# Patient Record
Sex: Male | Born: 1990 | Race: White | Hispanic: No | Marital: Married | Smoking: Current every day smoker
Health system: Southern US, Community
[De-identification: ages and names within clinical notes are randomized; demographics above are authoritative.]

## PROBLEM LIST (undated history)

## (undated) DIAGNOSIS — K649 Unspecified hemorrhoids: Secondary | ICD-10-CM

## (undated) HISTORY — DX: Unspecified hemorrhoids: K64.9

---
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
## 2010-03-07 ENCOUNTER — Ambulatory Visit: Payer: Self-pay | Admitting: Internal Medicine

## 2010-05-24 ENCOUNTER — Emergency Department: Payer: Self-pay | Admitting: Emergency Medicine

## 2010-05-26 ENCOUNTER — Emergency Department: Payer: Self-pay | Admitting: Emergency Medicine

## 2010-05-31 ENCOUNTER — Emergency Department: Payer: Self-pay | Admitting: Emergency Medicine

## 2010-09-05 ENCOUNTER — Inpatient Hospital Stay: Payer: Self-pay | Admitting: Psychiatry

## 2010-12-03 ENCOUNTER — Emergency Department: Payer: Self-pay | Admitting: Emergency Medicine

## 2011-01-20 ENCOUNTER — Emergency Department: Payer: Self-pay | Admitting: Emergency Medicine

## 2014-10-07 ENCOUNTER — Emergency Department: Payer: Self-pay | Admitting: Emergency Medicine

## 2014-10-10 ENCOUNTER — Encounter: Payer: Self-pay | Admitting: General Surgery

## 2014-10-10 ENCOUNTER — Ambulatory Visit (INDEPENDENT_AMBULATORY_CARE_PROVIDER_SITE_OTHER): Payer: Self-pay | Admitting: General Surgery

## 2014-10-10 VITALS — BP 120/74 | HR 82 | Resp 14 | Ht 66.0 in | Wt 100.0 lb

## 2014-10-10 DIAGNOSIS — K645 Perianal venous thrombosis: Secondary | ICD-10-CM

## 2014-10-10 NOTE — Patient Instructions (Signed)
Patient will follow up prn

## 2014-10-10 NOTE — Progress Notes (Signed)
Patient ID: Mario Stanley, male   DOB: 06/03/1991, 24 y.o.   MRN: [id number]  Chief Complaint  Patient presents with  . Other    hemorrhoids    HPI Mario Stanley is a 24 y.o. male here today for a evaluation of external hemorrhoids. Patient noticed this about a week ago . He states the hemorrhoids has got been since Sunday and are very painful to sit.   HPI  Past Medical History  Diagnosis Date  . Hemorrhoids     History reviewed. No pertinent past surgical history.  History reviewed. No pertinent family history.  Social History History  Substance Use Topics  . Smoking status: Current Every Day Smoker -- 1.00 packs/day    Types: Cigarettes  . Smokeless tobacco: Not on file  . Alcohol Use: No    Allergies  Allergen Reactions  . Depocyt [Cytarabine Liposome] Rash    No current outpatient prescriptions on file.   No current facility-administered medications for this visit.    Review of Systems Review of Systems  Constitutional: Negative.   Respiratory: Negative.   Cardiovascular: Negative.   Gastrointestinal: Positive for nausea, vomiting and diarrhea. Negative for abdominal pain, blood in stool, abdominal distention, anal bleeding and rectal pain.    Blood pressure 120/74, pulse 82, resp. rate 14, height 5\' 6"  (1.676 m), weight 100 lb (45.36 kg).  Physical Exam Physical Exam  Constitutional: He is oriented to person, place, and time. He appears well-developed and well-nourished.  Genitourinary: Rectal exam shows external hemorrhoid.     Neurological: He is alert and oriented to person, place, and time.  Skin: Skin is warm and dry.    Data Reviewed ER note  Assessment    Thrombosed external hemorrhoid.     Plan    With consent, hemorrhoid was infiltrated with 1 ml 1% Xylocaine. Small incision made and a 5 mm clot removed.  Advised to continue with the local cream he is using now.  Follow up prn.        SANKAR,SEEPLAPUTHUR  G 10/10/2014, 10:10 AM

## 2014-10-11 ENCOUNTER — Encounter: Payer: Self-pay | Admitting: *Deleted

## 2018-03-04 ENCOUNTER — Other Ambulatory Visit: Payer: Self-pay | Admitting: Internal Medicine

## 2018-03-04 ENCOUNTER — Ambulatory Visit
Admission: RE | Admit: 2018-03-04 | Discharge: 2018-03-04 | Disposition: A | Discharge status: Home / Self Care | Payer: 59 | Source: Ambulatory Visit | Attending: Internal Medicine | Admitting: Internal Medicine

## 2018-03-04 DIAGNOSIS — R634 Abnormal weight loss: Secondary | ICD-10-CM | POA: Insufficient documentation

## 2019-04-22 ENCOUNTER — Other Ambulatory Visit: Payer: Self-pay

## 2019-04-22 ENCOUNTER — Ambulatory Visit (INDEPENDENT_AMBULATORY_CARE_PROVIDER_SITE_OTHER): Payer: 59

## 2019-04-22 ENCOUNTER — Ambulatory Visit
Admission: EM | Admit: 2019-04-22 | Discharge: 2019-04-22 | Disposition: A | Discharge status: Home / Self Care | Payer: 59 | Attending: Internal Medicine | Admitting: Internal Medicine

## 2019-04-22 ENCOUNTER — Encounter: Payer: Self-pay | Admitting: Gynecology

## 2019-04-22 DIAGNOSIS — R091 Pleurisy: Secondary | ICD-10-CM | POA: Diagnosis not present

## 2019-04-22 DIAGNOSIS — R0789 Other chest pain: Secondary | ICD-10-CM | POA: Diagnosis not present

## 2019-04-22 MED ORDER — PREDNISONE 10 MG (21) PO TBPK: ORAL_TABLET | Freq: Every day | ORAL | 0 refills | Status: AC

## 2019-04-22 NOTE — ED Provider Notes (Signed)
[facility]    CSN: [id number] Arrival date & time: 04/22/19  0944      History   Chief Complaint Chief Complaint  Patient presents with  . Chest Pain    HPI Mario Stanley is a 28 y.o. male. who presents with onset of R chest pain 6 days ago. Pain is provoked with deep breathing, coughing, touch, exhaling, moving thorax.  Pain got worse yesterday to the point he could not more his R arm up to take his shirt off, roll over in bed, or pull the fridge door open. The pain is described as sharp and intermittent. He denies SOB. There is not pain is sits still. Denies injuring himself, abdominal pain, N/V Denies traveling in the past month or been laying in bed for prolonged periods of time. He does a lot of physical work at work, and has not done anything different at work.     Past Medical History:  Diagnosis Date  . Hemorrhoids     There are no active problems to display for this patient.   History reviewed. No pertinent surgical history.     Home Medications    Prior to Admission medications   Medication Sig Start Date End Date Taking? Authorizing Provider  predniSONE (STERAPRED UNI-PAK 21 TAB) 10 MG (21) TBPK tablet Take by mouth daily. Take 6 tabs by mouth daily  for 2 days, then 5 tabs for 2 days, then 4 tabs for 2 days, then 3 tabs for 2 days, 2 tabs for 2 days, then 1 tab by mouth daily for 2 days 04/22/19   Rodriguez-Southworth, Nettie ElmSylvia, PA-C  sulfamethoxazole-trimethoprim (BACTRIM DS) 800-160 MG tablet Take 1 tablet by mouth every 12 (twelve) hours. 01/07/19   [provider]    Family History Family History  Problem Relation Age of Onset  . Thyroid disease Mother   . Hypertension Mother     Social History Social History   Tobacco Use  . Smoking status: Current Every Day Smoker    Packs/day: 1.00    Types: Cigars  . Smokeless tobacco: Never Used  Substance Use Topics  . Alcohol use: No    Alcohol/week: 0.0 standard drinks  . Drug  use: Yes    Types: Marijuana     Allergies   Depocyt [cytarabine liposome] and Valproic acid   Review of Systems Review of Systems  Constitutional: Negative for appetite change, chills, diaphoresis, fever and unexpected weight change.  HENT: Negative for congestion.   Respiratory: Negative for cough, chest tightness and shortness of breath.   Cardiovascular: Positive for chest pain. Negative for palpitations and leg swelling.  Gastrointestinal: Negative for abdominal distention, abdominal pain, nausea and vomiting.  Genitourinary: Negative for difficulty urinating.  Musculoskeletal: Negative for gait problem.  Skin: Negative for rash.  Neurological: Negative for dizziness and numbness.     Physical Exam Triage Vital Signs ED Triage Vitals  Enc Vitals Group     BP 04/22/19 1001 105/74     Pulse Rate 04/22/19 1001 75     Resp 04/22/19 1001 16     Temp 04/22/19 1001 98 F (36.7 C)     Temp Source 04/22/19 1001 Oral     SpO2 04/22/19 1001 99 %     Weight 04/22/19 0956 110 lb (49.9 kg)     Height 04/22/19 0956 5\' 3"  (1.6 m)     Head Circumference --      Peak Flow --      Pain  Score 04/22/19 0955 7     Pain Loc --      Pain Edu? --      Excl. in [place]? --    No data found.  Updated Vital Signs BP 105/74 (BP Location: Left Arm)   Pulse 75   Temp 98 F (36.7 C) (Oral)   Resp 16   Ht 5\' 3"  (1.6 m)   Wt 110 lb (49.9 kg)   SpO2 99%   BMI 19.49 kg/m   Visual Acuity Right Eye Distance:   Left Eye Distance:   Bilateral Distance:    Right Eye Near:   Left Eye Near:    Bilateral Near:     Physical Exam Vitals signs and nursing note reviewed.  Constitutional:      General: He is in acute distress.     Appearance: He is not ill-appearing, toxic-appearing or diaphoretic.     Comments: Was laying reclined comfortably when I went into the room and during the interview, after exam sitting up, he could not lay back down due to severe pain.  HENT:     Head:  Normocephalic.  Eyes:     Pupils: Pupils are equal, round, and reactive to light.  Neck:     Musculoskeletal: Neck supple.     Thyroid: Thyromegaly present.     Trachea: No tracheal deviation.  Cardiovascular:     Rate and Rhythm: Regular rhythm.     Heart sounds: Normal heart sounds. No murmur.  Pulmonary:     Effort: No respiratory distress.     Comments: Could not take deep breaths due to pain Chest:     Chest wall: Deformity and tenderness present. No mass or crepitus.     Comments: 1/3 of R scapula soft tissue looks swollen compared to the L and this area is tender.  R upper, mid chest wall is tender to light palpation up to 9th rib, but with light touch.  Abdominal:     General: Bowel sounds are normal. There is no abdominal bruit.     Palpations: Abdomen is soft. There is no hepatomegaly, splenomegaly or mass.     Tenderness: There is no abdominal tenderness. There is no guarding or rebound.     Comments: Palpation of his RUQ caused R upper chest pain  Musculoskeletal: Normal range of motion.     Right lower leg: No edema.     Left lower leg: No edema.     Comments: Unable to move his R arm up due to provoking R chest pain  Lymphadenopathy:     Cervical: No cervical adenopathy.  Skin:    General: Skin is warm and dry.     Capillary Refill: Capillary refill takes less than 2 seconds.     Findings: No ecchymosis, erythema or rash.  Neurological:     Mental Status: He is alert and oriented to person, place, and time.  Psychiatric:        Mood and Affect: Mood normal.        Behavior: Behavior normal.    UC Treatments / Results  Labs (all labs ordered are listed, but only abnormal results are displayed) Labs Reviewed - No data to display  EKG  Sinus brady with QR pattern in V1 sugests R ventricular conduction delay. Borderline EKG.   Radiology Dg Chest 2 View  Result Date: 04/22/2019 CLINICAL DATA:  Right upper chest pain EXAM: CHEST - 2 VIEW COMPARISON:   03/04/2018 FINDINGS: The heart size and mediastinal  contours are within normal limits. Both lungs are clear. The visualized skeletal structures are unremarkable. IMPRESSION: No acute abnormality of the lungs. Electronically Signed   By: Lauralyn Primes M.D.   On: 04/22/2019 10:47    Procedures Procedures (including critical care time)  Medications Ordered in UC Medications - No data to display  Initial Impression / Assessment and Plan / UC Course  I have reviewed the triage vital signs and the nursing notes. Pertinent  imaging results that were available during my care of the patient were reviewed by me and considered in my medical decision making (see chart for details). I consulted this case with my coworker PA Mario Stanley who agreed with my Dx and treatment.  I placed pt on prednisone  And may take Tylenol as well. If he gets worse, needs to go to ER.  He does not have a PCP.    Final Clinical Impressions(s) / UC Diagnoses   Final diagnoses:  Chest wall pain  Pleurisy     Discharge Instructions     Your pain should be improving in 48-72 hours, but if you get worse, please go to the emergency room where you will need to have more test than what we can do here.  You may take Tylenol 500 mg 2 every 6 hours for pain, but dont take Ibuprofen while on the prednisone.     ED Prescriptions    Medication Sig Dispense Auth. Provider   predniSONE (STERAPRED UNI-PAK 21 TAB) 10 MG (21) TBPK tablet Take by mouth daily. Take 6 tabs by mouth daily  for 2 days, then 5 tabs for 2 days, then 4 tabs for 2 days, then 3 tabs for 2 days, 2 tabs for 2 days, then 1 tab by mouth daily for 2 days 42 tablet Rodriguez-Southworth, Nettie Elm, PA-C     PDMP not reviewed this encounter.   Garey Ham, PA-C 04/22/19 1757

## 2019-04-22 NOTE — Discharge Instructions (Addendum)
Your pain should be improving in 48-72 hours, but if you get worse, please go to the emergency room where you will need to have more test than what we can do here.  You may take Tylenol 500 mg 2 every 6 hours for pain, but dont take Ibuprofen while on the prednisone.

## 2019-04-22 NOTE — ED Triage Notes (Signed)
Patient c/o right side chest pain x 6 days. Per pt. Symptoms; hurts when taking a deep breath / when coughing and at night sleeping.

## 2019-04-23 ENCOUNTER — Telehealth: Payer: Self-pay | Admitting: Emergency Medicine

## 2019-04-23 NOTE — Telephone Encounter (Signed)
Patient's mother called asking if her son's work not could be changed to include out of work on 04/24/19 Monday.  Patient was seen on Saturday by Shelby Mattocks, PA.  Tamala Ser, PA said that would be okay.  Mother was notified that the work note will be printed and placed at the front desk at [facility] for pick up.  Mother verbalized understanding.

## 2020-09-18 IMAGING — CR DG CHEST 2V
2 series · 2 of 2 positions shown · non-contrast
Comparison: 03/04/2018

CLINICAL DATA: Right upper chest pain

EXAM:
CHEST - 2 VIEW

[chest pa]
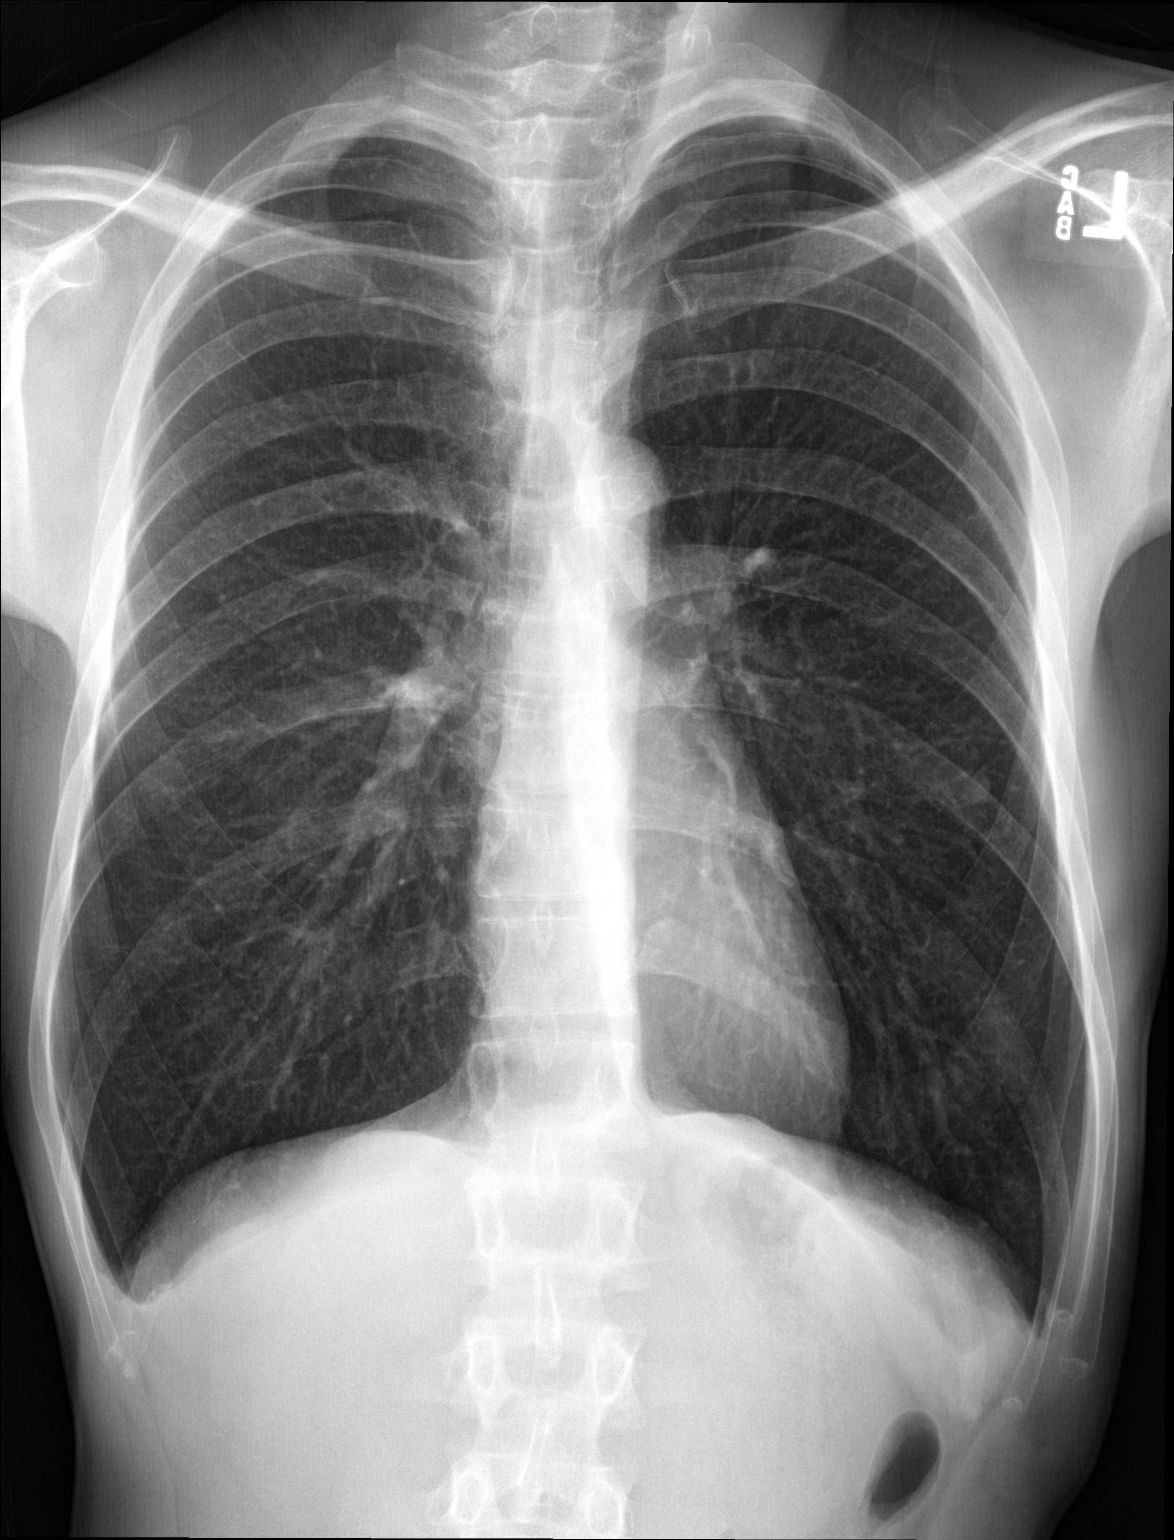

[chest lat]
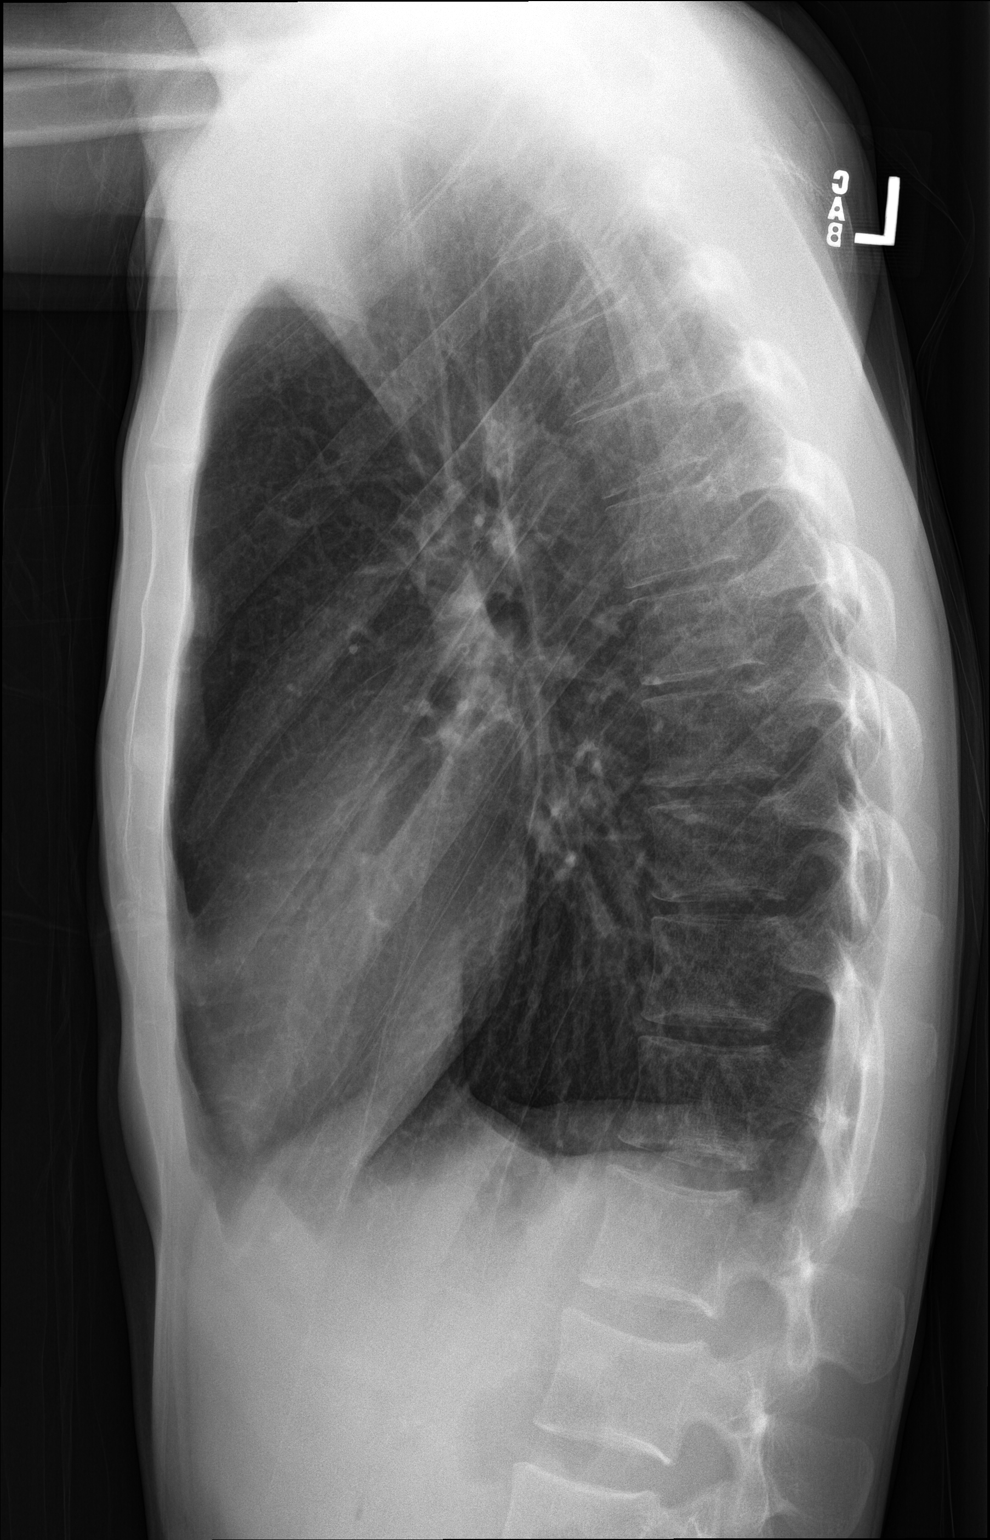

[2 of 2 positions shown; findings below may reference images not displayed]

FINDINGS: The heart size and mediastinal contours are within normal limits.
Both lungs are clear. The visualized skeletal structures are
unremarkable.
IMPRESSION: No acute abnormality of the lungs.

## 2022-02-26 ENCOUNTER — Emergency Department: Payer: No Typology Code available for payment source

## 2022-02-26 ENCOUNTER — Other Ambulatory Visit: Payer: Self-pay

## 2022-02-26 DIAGNOSIS — S46912A Strain of unspecified muscle, fascia and tendon at shoulder and upper arm level, left arm, initial encounter: Secondary | ICD-10-CM | POA: Diagnosis not present

## 2022-02-26 DIAGNOSIS — M542 Cervicalgia: Secondary | ICD-10-CM | POA: Insufficient documentation

## 2022-02-26 DIAGNOSIS — Y9289 Other specified places as the place of occurrence of the external cause: Secondary | ICD-10-CM | POA: Diagnosis not present

## 2022-02-26 DIAGNOSIS — S4992XA Unspecified injury of left shoulder and upper arm, initial encounter: Secondary | ICD-10-CM | POA: Diagnosis present
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
## 2022-02-27 ENCOUNTER — Emergency Department: Payer: No Typology Code available for payment source

## 2022-02-27 ENCOUNTER — Emergency Department
Admission: EM | Admit: 2022-02-27 | Discharge: 2022-02-27 | Disposition: A | Discharge status: Home / Self Care | Payer: No Typology Code available for payment source | Attending: Emergency Medicine | Admitting: Emergency Medicine

## 2022-02-27 DIAGNOSIS — M542 Cervicalgia: Secondary | ICD-10-CM

## 2022-02-27 DIAGNOSIS — M7918 Myalgia, other site: Secondary | ICD-10-CM

## 2022-02-27 DIAGNOSIS — S46912A Strain of unspecified muscle, fascia and tendon at shoulder and upper arm level, left arm, initial encounter: Secondary | ICD-10-CM

## 2022-02-27 MED ORDER — DOCUSATE SODIUM 100 MG PO CAPS: ORAL_CAPSULE | ORAL | 0 refills | Status: AC

## 2022-02-27 MED ORDER — OXYCODONE-ACETAMINOPHEN 5-325 MG PO TABS
2.0000 | ORAL_TABLET | Freq: Once | ORAL | Status: AC
  Administered 2022-02-27: 2 via ORAL
  Filled 2022-02-27: qty 2

## 2022-02-27 MED ORDER — OXYCODONE-ACETAMINOPHEN 5-325 MG PO TABS: 2.0000 | ORAL_TABLET | Freq: Three times a day (TID) | ORAL | 0 refills | Status: AC | PRN

## 2022-02-27 NOTE — Discharge Instructions (Signed)
Your evaluation was reassuring with no evidence of any fractures or dislocations.  You can expect to be very sore for at least a few days, possibly a week or more.  Please use over-the-counter ibuprofen and/or Tylenol according to label instructions.  Take Percocet as prescribed for severe pain. Do not drink alcohol, drive or participate in any other potentially dangerous activities while taking this medication as it may make you sleepy. Do not take this medication with any other sedating medications, either prescription or over-the-counter. If you were prescribed Percocet or Vicodin, do not take these with acetaminophen (Tylenol) as it is already contained within these medications.   This medication is an opiate (or narcotic) pain medication and can be habit forming.  Use it as little as possible to achieve adequate pain control.  Do not use or use it with extreme caution if you have a history of opiate abuse or dependence.  If you are on a pain contract with your primary care doctor or a pain specialist, be sure to let them know you were prescribed this medication today from the [facility] Emergency Department.  This medication is intended for your use only - do not give any to anyone else and keep it in a secure place where nobody else, especially children, have access to it.  It will also cause or worsen constipation, so you may want to consider taking an over-the-counter stool softener while you are taking this medication.    Return to the emergency department if you develop new or worsening symptoms that concern you.
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
## 2022-02-27 NOTE — ED Notes (Signed)
Forbach MD at bedside.
